# Patient Record
Sex: Female | Born: 1960 | Race: Black or African American | Hispanic: No | Marital: Married | Smoking: Never smoker
Health system: Southern US, Community
[De-identification: ages and names within clinical notes are randomized; demographics above are authoritative.]

## PROBLEM LIST (undated history)

## (undated) DIAGNOSIS — J45909 Unspecified asthma, uncomplicated: Secondary | ICD-10-CM

## (undated) HISTORY — DX: Unspecified asthma, uncomplicated: J45.909

---

## 2014-01-09 ENCOUNTER — Ambulatory Visit (INDEPENDENT_AMBULATORY_CARE_PROVIDER_SITE_OTHER): Payer: Self-pay | Admitting: Family Medicine

## 2014-01-09 ENCOUNTER — Encounter: Payer: Self-pay | Admitting: Family Medicine

## 2014-01-09 ENCOUNTER — Ambulatory Visit (INDEPENDENT_AMBULATORY_CARE_PROVIDER_SITE_OTHER): Payer: Self-pay

## 2014-01-09 VITALS — BP 144/90 | HR 82 | Temp 98.6°F | Resp 16 | Ht 64.0 in | Wt 168.8 lb

## 2014-01-09 DIAGNOSIS — M25561 Pain in right knee: Secondary | ICD-10-CM

## 2014-01-09 DIAGNOSIS — M25551 Pain in right hip: Secondary | ICD-10-CM

## 2014-01-09 DIAGNOSIS — M545 Low back pain, unspecified: Secondary | ICD-10-CM

## 2014-01-09 DIAGNOSIS — M4726 Other spondylosis with radiculopathy, lumbar region: Secondary | ICD-10-CM

## 2014-01-09 DIAGNOSIS — M47817 Spondylosis without myelopathy or radiculopathy, lumbosacral region: Secondary | ICD-10-CM

## 2014-01-09 DIAGNOSIS — M25569 Pain in unspecified knee: Secondary | ICD-10-CM

## 2014-01-09 DIAGNOSIS — M25559 Pain in unspecified hip: Secondary | ICD-10-CM

## 2014-01-09 MED ORDER — DICLOFENAC SODIUM 75 MG PO TBEC: 75.0000 mg | DELAYED_RELEASE_TABLET | Freq: Two times a day (BID) | ORAL | Status: DC

## 2014-01-09 NOTE — Progress Notes (Signed)
Subjective: 53 year old lady from [place] who has been in the [place] for 2 or 3 months visiting her son. She will probably be here 6 months. She has a long history of pain in her right hip. She hurts in the low back, right hip, and down to her right knee.has [place]. She thinks those records on.   Objective   overweight lady in no acute distress. Abdomen soft and nontender. Stat tenderness in the lower lumbar spine area, especially toward the right SI and right hip regions. She's tender in the right posterior thigh but not particularly trochanteric region. She has a ganglion or nodular area along the lateral hamstring tendon at the right knee. It is a little tender. No Baker's cyst be palpated the the pain radiates from her hip down to her knee. Straight leg raise test negative. She does not speaking [place], but her son interpreted.  [facility] reading (PRIMARY) by  Dr. Alwyn RenHopper Mild djd of lumbar spine.  Mild wedging of L5. 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 Possible wedging of L5 mildly  Plan:  Diclofenac 75 bid pain Tylenol.   Exercise and wt. Loss  Return prn. .Marland Kitchen

## 2014-01-09 NOTE — Patient Instructions (Signed)
Take diclofenac one twice daily for pain and inflammation. Take it with food. This is a prescription.  For milder pain take acetaminophen 500 mg 2 tablets 2 or 3 times a day. This can be purchased over-the-counter.  Try to do some walking to get exercise every day. Try to eat less to see if you can lose some pounds.  Return if not improving.

## 2014-02-27 ENCOUNTER — Ambulatory Visit (INDEPENDENT_AMBULATORY_CARE_PROVIDER_SITE_OTHER): Payer: Self-pay | Admitting: Family Medicine

## 2014-02-27 VITALS — BP 204/102 | HR 63 | Temp 97.8°F | Resp 17 | Ht 63.5 in | Wt 169.2 lb

## 2014-02-27 DIAGNOSIS — M25559 Pain in unspecified hip: Secondary | ICD-10-CM

## 2014-02-27 DIAGNOSIS — M4726 Other spondylosis with radiculopathy, lumbar region: Secondary | ICD-10-CM

## 2014-02-27 DIAGNOSIS — M79604 Pain in right leg: Secondary | ICD-10-CM

## 2014-02-27 DIAGNOSIS — M545 Low back pain, unspecified: Secondary | ICD-10-CM

## 2014-02-27 DIAGNOSIS — M25552 Pain in left hip: Secondary | ICD-10-CM

## 2014-02-27 DIAGNOSIS — M79609 Pain in unspecified limb: Secondary | ICD-10-CM

## 2014-02-27 DIAGNOSIS — M47817 Spondylosis without myelopathy or radiculopathy, lumbosacral region: Secondary | ICD-10-CM

## 2014-02-27 DIAGNOSIS — E669 Obesity, unspecified: Secondary | ICD-10-CM

## 2014-02-27 DIAGNOSIS — M5430 Sciatica, unspecified side: Secondary | ICD-10-CM

## 2014-02-27 DIAGNOSIS — M5431 Sciatica, right side: Secondary | ICD-10-CM

## 2014-02-27 LAB — GLUCOSE, POCT (MANUAL RESULT ENTRY): POC Glucose: 86 mg/dl (ref 70–99)

## 2014-02-27 MED ORDER — TRAMADOL HCL 50 MG PO TABS: 50.0000 mg | ORAL_TABLET | Freq: Three times a day (TID) | ORAL | Status: AC | PRN

## 2014-02-27 MED ORDER — PREDNISONE 20 MG PO TABS: ORAL_TABLET | ORAL | Status: AC

## 2014-02-27 MED ORDER — DICLOFENAC SODIUM 75 MG PO TBEC: 75.0000 mg | DELAYED_RELEASE_TABLET | Freq: Two times a day (BID) | ORAL | Status: AC

## 2014-02-27 NOTE — Progress Notes (Signed)
Subjective: Patient calmed down transiently after taking the diclofenac previously, then the pain came back. It is been worse at times. He gets up to a 7 or 8 pain at times. It radiates from the right hip area down the right leg behind her right knee. It is not constant. It hurts her in the leg more if she tries to squat.  Objective: Obese lady in no acute distress this time. No CVA tenderness. Abdomen soft without mass or tenderness. She has no spine tenderness. She is tender over the right SI joint and in the right hip buttock area. Mild tenderness around the trochanteric area. No edema. No calf tenderness. Knee has good motion with no pain or tenderness. Straight leg raising test is negative except for causing pain but she can easily go up to 90.   Results for orders placed in visit on 02/27/14  GLUCOSE, POCT (MANUAL RESULT ENTRY)      Result Value Ref Range   POC Glucose 86  70 - 99 mg/dl   Assessment: Right hip pain with radiation to right leg Right calf pain Sciatica  Plan: Treated for sciatica. If symptoms persist might need an MRI, but she is returning to [place] in a few weeks. Prednisone Diclofenac Tramadol

## 2014-02-27 NOTE — Patient Instructions (Signed)
Take the prednisone each morning after breakfast. 3 tablets for 3 days, then 2 tablets for 3 days, then one tablet for 3 days, then one half tablet daily for 4 more days.  After completing the prednisone, if you have more pain, you can take the diclofenac one twice daily. I gave you enough for one month, which should last you even after your back in [place].  If you're doing abruptly worse at anytime please return.  If you keep having a lot of symptoms, you may be needing to get an MRI which is primarily done to see if this can be treated with surgery. Therefore we want to try everything else first.

## 2016-04-29 IMAGING — CR DG PELVIS 1-2V
1 series · 1 of 1 positions shown · non-contrast
Comparison: None.

CLINICAL DATA: Pelvic pain.  Right hip pain.

EXAM:
PELVIS - 1-2 VIEW

[AP]
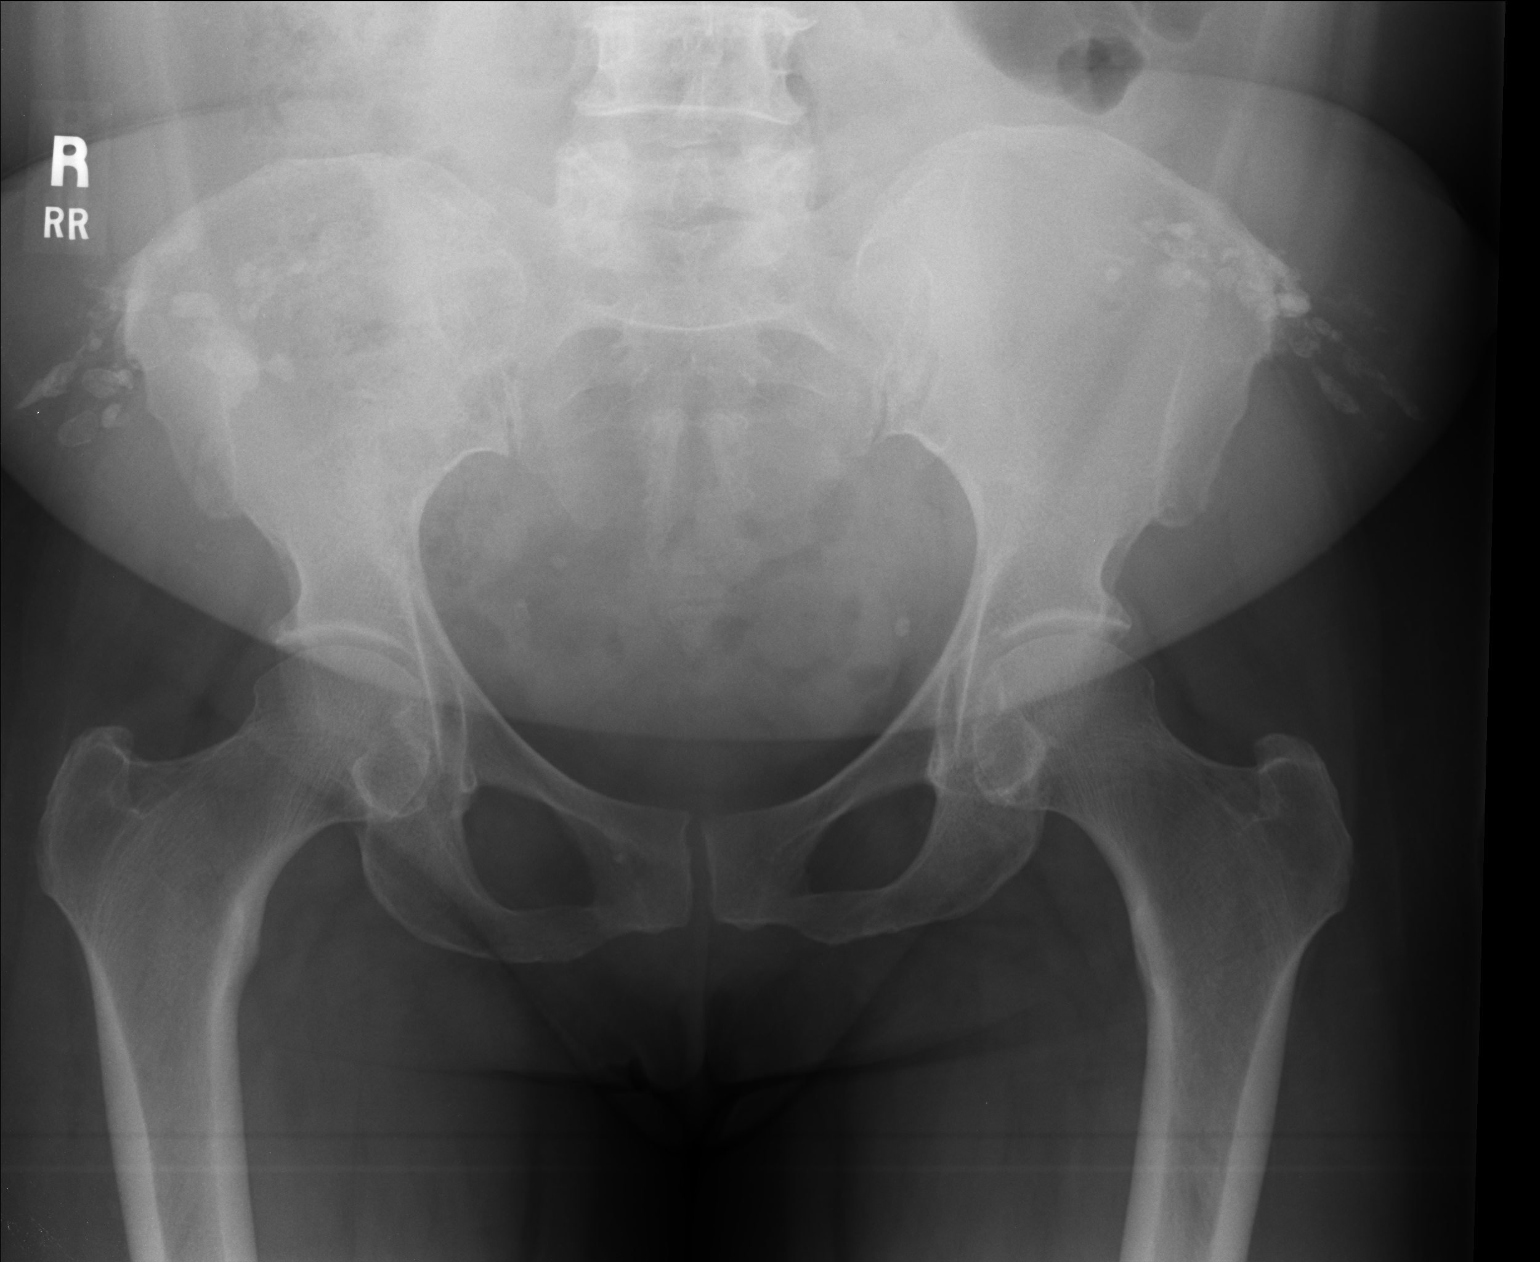

[1 of 1 positions shown; findings below may reference images not displayed]

FINDINGS: No pelvic fracture or diastasis. Both hip joints show normal
alignment and no significant degenerative disease. No bony lesions
or destruction. Soft tissue show multiple bilateral calcified
granuloma is in the buttock regions, likely reflecting fat necrosis
from prior injections.
IMPRESSION: Unremarkable bony pelvis.
